# Patient Record
Sex: Female | Born: 1995 | Race: Black or African American | Hispanic: No | Marital: Single | Smoking: Never smoker
Health system: Southern US, Community
[De-identification: ages and names within clinical notes are randomized; demographics above are authoritative.]

## PROBLEM LIST (undated history)

## (undated) DIAGNOSIS — Z789 Other specified health status: Secondary | ICD-10-CM

## (undated) HISTORY — DX: Other specified health status: Z78.9

## (undated) HISTORY — PX: NO PAST SURGERIES: SHX2092

---

## 2020-11-09 ENCOUNTER — Encounter: Payer: Self-pay | Admitting: Obstetrics & Gynecology

## 2021-06-18 ENCOUNTER — Encounter (HOSPITAL_COMMUNITY): Payer: Self-pay | Admitting: Emergency Medicine

## 2021-06-18 ENCOUNTER — Other Ambulatory Visit: Payer: Self-pay

## 2021-06-18 ENCOUNTER — Ambulatory Visit (HOSPITAL_COMMUNITY)
Admission: EM | Admit: 2021-06-18 | Discharge: 2021-06-18 | Disposition: A | Discharge status: Home / Self Care | Payer: Medicaid Other | Attending: Emergency Medicine | Admitting: Emergency Medicine

## 2021-06-18 DIAGNOSIS — Z3201 Encounter for pregnancy test, result positive: Secondary | ICD-10-CM

## 2021-06-18 LAB — POCT URINALYSIS DIPSTICK, ED / UC
Bilirubin Urine: NEGATIVE
Glucose, UA: NEGATIVE mg/dL
Hgb urine dipstick: NEGATIVE
Ketones, ur: NEGATIVE mg/dL
Nitrite: NEGATIVE
Protein, ur: NEGATIVE mg/dL
Specific Gravity, Urine: 1.01 (ref 1.005–1.030)
Urobilinogen, UA: >=8 mg/dL (ref 0.0–1.0)
pH: 6 (ref 5.0–8.0)

## 2021-06-18 LAB — POC URINE PREG, ED: Preg Test, Ur: POSITIVE — AB

## 2021-06-18 MED ORDER — PRENATAL COMPLETE 14-0.4 MG PO TABS: 1.0000 | ORAL_TABLET | Freq: Every day | ORAL | 0 refills | Status: DC

## 2021-06-18 NOTE — ED Provider Notes (Signed)
[facility]    CSN: [id number] Arrival date & time: 06/18/21  1835      History   Chief Complaint Chief Complaint  Patient presents with   Nausea   Metrorrhagia    HPI Caylan Dericka Ostenson is a 25 y.o. female.   Patient here for evaluation of nausea and spotting for the past week.  LMP 05/07/2021.  Denies any abdominal pain or cramping.  Denies any dysuria, urgency, or frequency.  Denies any trauma, injury, or other precipitating event.  Denies any specific alleviating or aggravating factors.  Denies any fevers, chest pain, shortness of breath, N/V/D, numbness, tingling, weakness, abdominal pain, or headaches.     The history is provided by the patient.   History reviewed. No pertinent past medical history.  There are no problems to display for this patient.   History reviewed. No pertinent surgical history.  OB History   No obstetric history on file.      Home Medications    Prior to Admission medications   Medication Sig Start Date End Date Taking? Authorizing Provider  Prenatal Vit-Fe Fumarate-FA (PRENATAL COMPLETE) 14-0.4 MG TABS Take 1 tablet by mouth daily. 06/18/21  Yes Ivette Loyal, NP    Family History No family history on file.  Social History Social History   Tobacco Use   Smoking status: Never   Smokeless tobacco: Never  Substance Use Topics   Alcohol use: Not Currently   Drug use: Never     Allergies   Patient has no known allergies.   Review of Systems Review of Systems  Gastrointestinal:  Positive for nausea.  Genitourinary:  Positive for menstrual problem.  All other systems reviewed and are negative.   Physical Exam Triage Vital Signs ED Triage Vitals  Enc Vitals Group     BP 06/18/21 1909 131/73     Pulse Rate 06/18/21 1909 91     Resp 06/18/21 1909 15     Temp 06/18/21 1909 98.8 F (37.1 C)     Temp Source 06/18/21 1909 Oral     SpO2 06/18/21 1909 99 %     Weight --      Height 06/18/21 1909 5\' 3"  (1.6 m)      Head Circumference --      Peak Flow --      Pain Score 06/18/21 1907 0     Pain Loc --      Pain Edu? --      Excl. in GC? --    No data found.  Updated Vital Signs BP 131/73 (BP Location: Left Arm)   Pulse 91   Temp 98.8 F (37.1 C) (Oral)   Resp 15   Ht 5\' 3"  (1.6 m)   LMP 05/07/2021 (Exact Date)   SpO2 99%   Visual Acuity Right Eye Distance:   Left Eye Distance:   Bilateral Distance:    Right Eye Near:   Left Eye Near:    Bilateral Near:     Physical Exam Vitals and nursing note reviewed.  Constitutional:      General: She is not in acute distress.    Appearance: Normal appearance. She is not ill-appearing, toxic-appearing or diaphoretic.  HENT:     Head: Normocephalic and atraumatic.  Eyes:     Conjunctiva/sclera: Conjunctivae normal.  Cardiovascular:     Rate and Rhythm: Normal rate.     Pulses: Normal pulses.  Pulmonary:     Effort: Pulmonary effort is normal.  Abdominal:  General: Abdomen is flat.  Musculoskeletal:        General: Normal range of motion.     Cervical back: Normal range of motion.  Skin:    General: Skin is warm and dry.  Neurological:     General: No focal deficit present.     Mental Status: She is alert and oriented to person, place, and time.  Psychiatric:        Mood and Affect: Mood normal.     UC Treatments / Results  Labs (all labs ordered are listed, but only abnormal results are displayed) Labs Reviewed  POC URINE PREG, ED - Abnormal; Notable for the following components:      Result Value   Preg Test, Ur POSITIVE (*)    All other components within normal limits  POCT URINALYSIS DIPSTICK, ED / UC - Abnormal; Notable for the following components:   Leukocytes,Ua TRACE (*)    All other components within normal limits    EKG   Radiology No results found.  Procedures Procedures (including critical care time)  Medications Ordered in UC Medications - No data to display  Initial Impression / Assessment  and Plan / UC Course  I have reviewed the triage vital signs and the nursing notes.  Pertinent labs & imaging results that were available during my care of the patient were reviewed by me and considered in my medical decision making (see chart for details).    Assessment negative for red flags or concerns.  Urinalysis with no signs of infection.  Urine pregnancy test positive.  EDD 02/11/22, [redacted] weeks pregnant.  Prenatal vitamins prescribed daily.  Recommend eating small frequent meals or crackers to help with nausea.  May try ginger or mint for nausea.  Patient fact sheet on medications that are safe to take during pregnancy.  Follow up with [facility] as soon as possible.   Final Clinical Impressions(s) / UC Diagnoses   Final diagnoses:  Positive pregnancy test     Discharge Instructions      Take the prenatal vitamin daily.    Keep crackers or other small snacks around to help with nausea.  You can also try ginger (ginger candy or gingerale) or mint to help with nausea.    Follow up with [facility] as soon as possible.       ED Prescriptions     Medication Sig Dispense Auth. Provider   Prenatal Vit-Fe Fumarate-FA (PRENATAL COMPLETE) 14-0.4 MG TABS Take 1 tablet by mouth daily. 60 tablet Ivette Loyal, NP      PDMP not reviewed this encounter.   Ivette Loyal, NP 06/18/21 1942

## 2021-06-18 NOTE — Discharge Instructions (Addendum)
Take the prenatal vitamin daily.    Keep crackers or other small snacks around to help with nausea.  You can also try ginger (ginger candy or gingerale) or mint to help with nausea.    Follow up with [facility] as soon as possible.

## 2021-06-18 NOTE — ED Triage Notes (Signed)
Patient c/o nausea and irregular started 1 week ago.   Patient endorses increased nausea and emesis in the morning.   Patient endorses " having one day of bleeding last week and it immediatly went off".   Patient endorses intermittent ABD pain at times " but not today".   Patient denies dysuria.   Patient has tried Tums with no relief of symptoms.

## 2021-06-28 ENCOUNTER — Ambulatory Visit: Payer: Medicaid Other

## 2021-06-28 ENCOUNTER — Other Ambulatory Visit: Payer: Self-pay

## 2021-06-28 VITALS — BP 118/71 | HR 74 | Wt 173.2 lb

## 2021-06-28 DIAGNOSIS — Z3480 Encounter for supervision of other normal pregnancy, unspecified trimester: Secondary | ICD-10-CM

## 2021-06-28 DIAGNOSIS — O219 Vomiting of pregnancy, unspecified: Secondary | ICD-10-CM

## 2021-06-28 MED ORDER — GOJJI WEIGHT SCALE MISC: 1.0000 | Freq: Every day | 0 refills | Status: DC | PRN

## 2021-06-28 MED ORDER — BLOOD PRESSURE MONITOR AUTOMAT DEVI: 1.0000 | Freq: Every day | 0 refills | Status: DC

## 2021-06-28 MED ORDER — PROMETHAZINE HCL 25 MG PO TABS
25.0000 mg | ORAL_TABLET | Freq: Four times a day (QID) | ORAL | 1 refills | Status: DC | PRN
Start: 2021-06-28 — End: 2021-07-17

## 2021-06-28 NOTE — Progress Notes (Signed)
   Location: [facility] Patient: clinic Provider: clinic  PRENATAL INTAKE SUMMARY  [name] presents today New OB Nurse Interview.  OB History     Gravida  2   Para      Term      Preterm      AB  1   Living         SAB  1   IAB      Ectopic      Multiple      Live Births             I have reviewed the patient's medical, obstetrical, social, and family histories, medications, and available lab results.  SUBJECTIVE She complains of nausea with vomiting for 1-2 weeks  OBJECTIVE Initial Nurse interview for history (New OB)  EDD: 02/11/2022 GA: [redacted]w[redacted]d G2P0010 FHT: not assessed due to gestational age  GENERAL APPEARANCE: alert, well appearing, in no apparent distress, oriented to person, place and time   ASSESSMENT Normal pregnancy  PLAN Prenatal care:  [facility] Labs to be completed at next visit with Edd Arbour, CNM [facility] for BP monitor and weight scale Rx for Phenergan 25 mg 1 tab PO every 6 hours at needed for nausea/vomiting Patient to sign up for Babyscripts Ultrasound less than 14 weeks to complete viability and dating  Follow Up Instructions:   I discussed the assessment and treatment plan with the patient. The patient was provided an opportunity to ask questions and all were answered. The patient agreed with the plan and demonstrated an understanding of the instructions.   The patient was advised to call back or seek an in-person evaluation if the symptoms worsen or if the condition fails to improve as anticipated.  I provided 40 minutes of  face-to-face time during this encounter.  Clovis Pu, RN

## 2021-06-28 NOTE — Progress Notes (Signed)
Patient arrived for GYN appt and reports pregnancy.  Encounter changed from GYN to Initial OB with nurse intake visit completed.  Cherre Robins MSN, CNM Advanced Practice Provider, [facility] for [employer] [facility]

## 2021-06-28 NOTE — Patient Instructions (Addendum)
AREA PEDIATRIC/FAMILY PRACTICE PHYSICIANS  [facility] [address] [place], [place] [address] Phone - [phone]   Fax - [phone]  [facility] [address] [place], [place]  [address] Phone - [phone]   Fax - [phone]  [facility] [address] [place], [place]  [address] Phone - [phone]   Fax - [phone]  [facility] [address] [place], [place]  [address] Phone - [phone]   Fax - [phone]  [facility] [address] [place], [place]  [address] Phone - [phone]   Fax - [phone]  [facility] [address] [place], [place]  [address] Phone - [phone]   Fax - [phone]  [facility] [address] [place], [place]  [address] Phone - [phone]   Fax - [phone]  [facility] [address] [place], [place]  [address] Phone - [phone]   Fax - [phone]  [facility] [address] [place], [place]  [address] Phone - [phone]   Fax - [phone] [facility] [address] [place], [place]  [address] Phone - [phone]   Fax - [phone]  [facility] [address] [place], [place]  [address] Phone - [phone]   Fax - [phone]  [facility] [address] [place], [place]  [address] Phone - [phone]   Fax - [phone]  [facility] [address] [place], [place]  [address] Phone - [phone]   Fax - [phone]  [facility] [address] [place], [place]  [address] Phone - [phone]  [facility] [address] [place], [place]  [address] Phone - [phone]   Fax - [phone]  [facility] [address] [place], [place]  [address] Phone - [phone]   Fax - [phone]  [facility] [address] [place], [place]  [address] Phone - [phone]   Fax - [phone]  [facility] [address] [place], [place]  [address] Phone - [phone]   Fax - [phone]   [facility] [address] [place], [place]  [address] Phone - [phone]   Fax - [phone]  [facility] [address] [place], [place]  [address] Phone - [phone]   Fax - [phone]  [facility] [address] [place], [place]  [address] Phone - [phone]   Fax - [phone]  [facility] [address] [place], [place]  [address] Phone - [phone]   Fax - [phone]  [facility] [address] [place], [place]  [address] Phone - [phone]   Fax - [phone] [facility] [address] [place], [place]  [address] Phone - [phone]   Fax - [phone]  [facility] [address] [place], [place]  [address] Phone - [phone]   Fax - [phone]   [facility] [address] [place],   [address] Phone - [phone]   Fax - [phone]   

## 2021-07-03 ENCOUNTER — Telehealth: Payer: Self-pay | Admitting: Medical

## 2021-07-03 ENCOUNTER — Ambulatory Visit
Admission: RE | Admit: 2021-07-03 | Discharge: 2021-07-03 | Disposition: A | Discharge status: Home / Self Care | Payer: Medicaid Other | Source: Ambulatory Visit | Attending: Certified Nurse Midwife | Admitting: Certified Nurse Midwife

## 2021-07-03 ENCOUNTER — Other Ambulatory Visit: Payer: Self-pay | Admitting: Certified Nurse Midwife

## 2021-07-03 ENCOUNTER — Other Ambulatory Visit: Payer: Self-pay

## 2021-07-03 DIAGNOSIS — Z3480 Encounter for supervision of other normal pregnancy, unspecified trimester: Secondary | ICD-10-CM

## 2021-07-03 NOTE — Telephone Encounter (Signed)
I called Cheyenne Cantu today at 10:36 AM and confirmed patient's identity using two patient identifiers. [name] results from earlier today were reviewed. Patient is scheduled for new OB visit at [facility] on 07/17/21. First trimester warning signs reviewed. Patient advised that due date will remain 02/11/22 based on LMP due to reasonably certain LMP. Patient voiced understanding and had no further questions.   US OB Comp Less 14 Wks  Result Date: 07/03/2021 CLINICAL DATA:  Confirming dates and or viability in a 25 year old female whose last menstrual. Would suggest 8 weeks 1 day gestation, no current quantitative beta HCG is available. EXAM: OBSTETRIC <14 WK ULTRASOUND TECHNIQUE: Transabdominal ultrasound was performed for evaluation of the gestation as well as the maternal uterus and adnexal regions. COMPARISON:  None FINDINGS: Intrauterine gestational sac: Single Yolk sac:  Visualized Embryo:  Visualized Cardiac Activity: Visualized Heart Rate: 157 bpm CRL:   15.8 mm   8 w 0 d                  [name] EDC: 02/12/2022 Subchorionic hemorrhage:  None visualized. Maternal uterus/adnexae: No free fluid in the pelvis. Mild distortion of uterine and endometrial contour favored to represent myometrial contraction. This is rightward and anterior. Normal appearance of RIGHT and LEFT ovary. IMPRESSION: Viable intrauterine pregnancy at 8 weeks 0 days by crown-rump length with heart rate of 157 beats per minute. Electronically Signed   By: Donzetta Kohut M.D.   On: 07/03/2021 10:15    Marny Lowenstein, PA-C 07/03/2021 10:36 AM

## 2021-07-17 ENCOUNTER — Other Ambulatory Visit (HOSPITAL_COMMUNITY)
Admission: RE | Admit: 2021-07-17 | Discharge: 2021-07-17 | Disposition: A | Discharge status: Home / Self Care | Payer: Medicaid Other | Source: Ambulatory Visit | Attending: Certified Nurse Midwife | Admitting: Certified Nurse Midwife

## 2021-07-17 ENCOUNTER — Other Ambulatory Visit: Payer: Self-pay

## 2021-07-17 ENCOUNTER — Ambulatory Visit (INDEPENDENT_AMBULATORY_CARE_PROVIDER_SITE_OTHER): Payer: Medicaid Other | Admitting: Certified Nurse Midwife

## 2021-07-17 VITALS — BP 112/72 | HR 88 | Wt 171.5 lb

## 2021-07-17 DIAGNOSIS — Z113 Encounter for screening for infections with a predominantly sexual mode of transmission: Secondary | ICD-10-CM

## 2021-07-17 DIAGNOSIS — Z481 Encounter for planned postprocedural wound closure: Secondary | ICD-10-CM | POA: Diagnosis present

## 2021-07-17 DIAGNOSIS — Z3491 Encounter for supervision of normal pregnancy, unspecified, first trimester: Secondary | ICD-10-CM | POA: Diagnosis not present

## 2021-07-17 DIAGNOSIS — Z3481 Encounter for supervision of other normal pregnancy, first trimester: Secondary | ICD-10-CM

## 2021-07-17 DIAGNOSIS — Z3A10 10 weeks gestation of pregnancy: Secondary | ICD-10-CM | POA: Diagnosis not present

## 2021-07-17 NOTE — Progress Notes (Signed)
History:   Cheyenne Cantu is a 25 y.o. G2P0010 at [redacted]w[redacted]d by LMP being seen today for her first obstetrical visit.  Her obstetrical history is significant for  one miscarriage at 11 weeks . Patient intends to breast and bottle feed. Pregnancy history fully reviewed.  Patient reports  improved nausea but still little appetite. Otherwise doing well but feeling some anxiety since it was at her first OB visit that they could not find a heartbeat in her last pregnancy . Leaving for college on Sunday, will be in [place], [place] (about 3hrs away). Unsure where to deliver but feels it should be closer to school.   HISTORY: OB History  Gravida Para Term Preterm AB Living  2 0 0 0 1 0  SAB IAB Ectopic Multiple Live Births  1 0 0 0 0    # Outcome Date GA Lbr Len/2nd Weight Sex Delivery Anes PTL Lv  2 Current           1 SAB 2020            Last pap smear was done "a couple of years ago" and was normal  Past Medical History:  Diagnosis Date   Medical history non-contributory    Past Surgical History:  Procedure Laterality Date   NO PAST SURGERIES     Family History  Problem Relation Age of Onset   Diabetes Father    Social History   Tobacco Use   Smoking status: Never    Passive exposure: Never   Smokeless tobacco: Never  Vaping Use   Vaping Use: Never used  Substance Use Topics   Alcohol use: Not Currently   Drug use: Never   No Known Allergies Current Outpatient Medications on File Prior to Visit  Medication Sig Dispense Refill   Blood Pressure Monitoring (BLOOD PRESSURE MONITOR AUTOMAT) DEVI 1 Device by Does not apply route daily. Automatic blood pressure cuff regular size. To monitor blood pressure regularly at home. ICD-10 code:Z34.90 1 each 0   Misc. Devices (GOJJI WEIGHT SCALE) MISC 1 Device by Does not apply route daily as needed. To weight self daily as needed at home. ICD-10 code: Z34.90 1 each 0   Prenatal Vit-Fe Fumarate-FA (PRENATAL COMPLETE) 14-0.4 MG TABS  Take 1 tablet by mouth daily. 60 tablet 0   promethazine (PHENERGAN) 25 MG tablet Take 1 tablet (25 mg total) by mouth every 6 (six) hours as needed for nausea or vomiting. (Patient not taking: Reported on 07/17/2021) 30 tablet 1   No current facility-administered medications on file prior to visit.    Review of Systems Pertinent items noted in HPI and remainder of comprehensive ROS otherwise negative. Physical Exam:   Vitals:   07/17/21 1101  BP: 112/72  Pulse: 88  Weight: 171 lb 8 oz (77.8 kg)   Fetal Heart Rate (bpm): 152 Bedside Ultrasound for FHR check: Viable intrauterine pregnancy with positive cardiac activity noted to great relief of patient. Patient informed that the ultrasound is considered a limited obstetric ultrasound and is not intended to be a complete ultrasound exam.  Patient also informed that the ultrasound is not being completed with the intent of assessing for fetal or placental anomalies or any pelvic abnormalities.  Explained that the purpose of today's ultrasound is to assess for fetal heart rate.  Patient acknowledges the purpose of the exam and the limitations of the study.  Constitutional: Well-developed, well-nourished pregnant female in no acute distress.  HEENT: PERRLA Skin: normal color and turgor,  no rash Cardiovascular: normal rate & rhythm, no murmur Respiratory: normal effort, lung sounds clear throughout GI: Abd soft, non-tender, pos BS x 4, gravid appropriate for gestational age MS: Extremities nontender, no edema, normal ROM Neurologic: Alert and oriented x 4.  GU: no CVA tenderness Pelvic: NEFG, physiologic discharge, no blood, cervix clean. Pap/swabs collected  Assessment & Plan:  1. Encounter for supervision of other normal pregnancy in first trimester - Doing well, not yet feeling fetal movement - Genetic Screening - Hemoglobin A1c - Culture, OB Urine - CBC/D/Plt+RPR+Rh+ABO+RubIgG... - Cytology - PAP( [facility]) - [name] MFM OB COMP + 14  WK; Future  2. [redacted] weeks gestation of pregnancy - Routine OB care - Discussed possible delivery sites, patient settled on the [place] area since it is closer to school and she has family there. Gave contact info for [facility], a low risk practice that accepts Medicaid in the [place]  3. Routine screening for STI (sexually transmitted infection) - Cervicovaginal ancillary only( [place])  4. Initial obstetric visit in first trimester - Initial labs drawn. - Continue prenatal vitamins. - Problem list reviewed and updated. - Genetic Screening discussed, First trimester screen, Quad screen, and NIPS: ordered. - Ultrasound discussed; fetal anatomic survey: ordered. - Anticipatory guidance about prenatal visits given including labs, ultrasounds, and testing. - Discussed usage of Babyscripts and virtual visits as additional source of managing and completing prenatal visits in midst of coronavirus and pandemic.   - Encouraged to complete MyChart Registration for her ability to review results, send requests, and have questions addressed.  - The nature of [place] [facility]/Faculty Practice with multiple MDs and Advanced Practice Providers was explained to patient; also emphasized that residents, students are part of our team. - Routine obstetric precautions reviewed. Encouraged to seek out care at office or emergency room [facility] preferred) for urgent and/or emergent concerns. Return in about 4 weeks (around 08/14/2021) for IN-PERSON, LOB.     Edd Arbour, MSN, CNM, IBCLC Certified Nurse Midwife, [facility]

## 2021-07-18 ENCOUNTER — Encounter: Payer: Self-pay | Admitting: *Deleted

## 2021-07-18 LAB — CERVICOVAGINAL ANCILLARY ONLY
Bacterial Vaginitis (gardnerella): NEGATIVE
Candida Glabrata: NEGATIVE
Candida Vaginitis: NEGATIVE
Chlamydia: NEGATIVE
Comment: NEGATIVE
Comment: NEGATIVE
Comment: NEGATIVE
Comment: NEGATIVE
Comment: NEGATIVE
Comment: NORMAL
Neisseria Gonorrhea: NEGATIVE
Trichomonas: NEGATIVE

## 2021-07-18 LAB — CBC/D/PLT+RPR+RH+ABO+RUBIGG...
Antibody Screen: NEGATIVE
Basophils Absolute: 0 10*3/uL (ref 0.0–0.2)
Basos: 0 %
EOS (ABSOLUTE): 0.1 10*3/uL (ref 0.0–0.4)
Eos: 1 %
HCV Ab: <0.1 s/co ratio (ref 0.0–0.9)
HIV Screen 4th Generation wRfx: NONREACTIVE
Hematocrit: 37.8 % (ref 34.0–46.6)
Hemoglobin: 12.5 g/dL (ref 11.1–15.9)
Hepatitis B Surface Ag: NEGATIVE
Immature Grans (Abs): 0 10*3/uL (ref 0.0–0.1)
Immature Granulocytes: 0 %
Lymphocytes Absolute: 1.8 10*3/uL (ref 0.7–3.1)
Lymphs: 23 %
MCH: 27.1 pg (ref 26.6–33.0)
MCHC: 33.1 g/dL (ref 31.5–35.7)
MCV: 82 fL (ref 79–97)
Monocytes Absolute: 0.5 10*3/uL (ref 0.1–0.9)
Monocytes: 6 %
Neutrophils Absolute: 5.4 10*3/uL (ref 1.4–7.0)
Neutrophils: 70 %
Platelets: 251 10*3/uL (ref 150–450)
RBC: 4.62 x10E6/uL (ref 3.77–5.28)
RDW: 15.6 % — ABNORMAL HIGH (ref 11.7–15.4)
RPR Ser Ql: NONREACTIVE
Rh Factor: NEGATIVE
Rubella Antibodies, IGG: 4.9 index (ref >0.99)
WBC: 7.8 10*3/uL (ref 3.4–10.8)

## 2021-07-18 LAB — HEMOGLOBIN A1C
Est. average glucose Bld gHb Est-mCnc: 111 mg/dL
Hgb A1c MFr Bld: 5.5 % (ref 4.8–5.6)

## 2021-07-18 LAB — HCV INTERPRETATION

## 2021-07-19 LAB — CYTOLOGY - PAP
Comment: NEGATIVE
Diagnosis: HIGH — AB
High risk HPV: POSITIVE — AB

## 2021-07-19 LAB — URINE CULTURE, OB REFLEX: Organism ID, Bacteria: NO GROWTH

## 2021-07-19 LAB — CULTURE, OB URINE

## 2021-08-14 ENCOUNTER — Encounter: Payer: Self-pay | Admitting: Nurse Practitioner

## 2021-08-14 DIAGNOSIS — R87610 Atypical squamous cells of undetermined significance on cytologic smear of cervix (ASC-US): Secondary | ICD-10-CM | POA: Insufficient documentation

## 2021-08-15 ENCOUNTER — Encounter: Payer: Medicaid Other | Admitting: Nurse Practitioner

## 2021-08-20 ENCOUNTER — Encounter: Payer: Medicaid Other | Admitting: Student

## 2021-08-21 ENCOUNTER — Encounter: Payer: Medicaid Other | Admitting: Obstetrics and Gynecology

## 2021-08-22 ENCOUNTER — Encounter: Payer: Self-pay | Admitting: General Practice

## 2021-08-26 ENCOUNTER — Telehealth: Payer: Self-pay | Admitting: General Practice

## 2021-08-26 NOTE — Telephone Encounter (Signed)
Attempted to call patient regarding horizon test results, message stated call could not be completed. Will send mychart message.

## 2021-09-05 ENCOUNTER — Other Ambulatory Visit: Payer: Self-pay

## 2021-09-05 ENCOUNTER — Other Ambulatory Visit (HOSPITAL_COMMUNITY)
Admission: RE | Admit: 2021-09-05 | Discharge: 2021-09-05 | Disposition: A | Discharge status: Home / Self Care | Payer: Medicaid Other | Source: Ambulatory Visit | Attending: Student | Admitting: Student

## 2021-09-05 ENCOUNTER — Ambulatory Visit (INDEPENDENT_AMBULATORY_CARE_PROVIDER_SITE_OTHER): Payer: Medicaid Other | Admitting: Obstetrics & Gynecology

## 2021-09-05 VITALS — BP 126/71 | HR 97 | Wt 177.1 lb

## 2021-09-05 DIAGNOSIS — R87610 Atypical squamous cells of undetermined significance on cytologic smear of cervix (ASC-US): Secondary | ICD-10-CM | POA: Insufficient documentation

## 2021-09-05 DIAGNOSIS — Z3480 Encounter for supervision of other normal pregnancy, unspecified trimester: Secondary | ICD-10-CM

## 2021-09-05 DIAGNOSIS — R87810 Cervical high risk human papillomavirus (HPV) DNA test positive: Secondary | ICD-10-CM

## 2021-09-05 NOTE — Progress Notes (Signed)
Patient ID: Tylea Hise, female   DOB: 1996/12/01, 25 y.o.   MRN: [id number]  Chief Complaint  Patient presents with   Routine Prenatal Visit  Colposcopy  HPI Sibyl Abbee Cremeens is a 25 y.o. female.  G2P0010  HPI  Indications: Pap smear on August 2022 showed: ASC cannot exclude high grade lesion [facility]). Previous colposcopy: no . Prior cervical treatment: no treatment.  Past Medical History:  Diagnosis Date   Medical history non-contributory     Past Surgical History:  Procedure Laterality Date   NO PAST SURGERIES      Family History  Problem Relation Age of Onset   Diabetes Father     Social History Social History   Tobacco Use   Smoking status: Never    Passive exposure: Never   Smokeless tobacco: Never  Vaping Use   Vaping Use: Never used  Substance Use Topics   Alcohol use: Not Currently   Drug use: Never    No Known Allergies  Current Outpatient Medications  Medication Sig Dispense Refill   Prenatal Vit-Fe Fumarate-FA (PRENATAL COMPLETE) 14-0.4 MG TABS Take 1 tablet by mouth daily. 60 tablet 0   Blood Pressure Monitoring (BLOOD PRESSURE MONITOR AUTOMAT) DEVI 1 Device by Does not apply route daily. Automatic blood pressure cuff regular size. To monitor blood pressure regularly at home. ICD-10 code:Z34.90 (Patient not taking: Reported on 09/05/2021) 1 each 0   Misc. Devices (GOJJI WEIGHT SCALE) MISC 1 Device by Does not apply route daily as needed. To weight self daily as needed at home. ICD-10 code: Z34.90 (Patient not taking: Reported on 09/05/2021) 1 each 0   No current facility-administered medications for this visit.    Review of Systems Review of Systems  Blood pressure 126/71, pulse 97, weight 177 lb 1.6 oz (80.3 kg), last menstrual period 05/07/2021.  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  Speculum placed in vagina and excellent visualization of cervix achieved, cervix swabbed x 3 with acetic acid solution. Patient given informed consent, signed copy in the chart, time out was performed.  Placed in lithotomy position. Cervix viewed with speculum and colposcope after application of acetic acid.   Colposcopy adequate?  yes Acetowhite lesions?yes 3-11 Punctation?no Mosaicism?  no Abnormal vasculature?  no Biopsies?yes 900 ECC?no  COMMENTS: Patient was given post procedure instructions.  Marland Kitchen    Specimens: Bx at 9 00  Complications: none.     Plan    Specimens labelled and sent to Pathology. Return to discuss Pathology results in 2 weeks.      Scheryl Darter 09/05/2021, 3:45 PM

## 2021-09-05 NOTE — Addendum Note (Signed)
Addended by: Isabell Jarvis on: 09/05/2021 04:58 PM   Modules accepted: Orders

## 2021-09-05 NOTE — Progress Notes (Signed)
   PRENATAL VISIT NOTE  Subjective:  Cheyenne Cantu is a 25 y.o. G2P0010 at [redacted]w[redacted]d being seen today for ongoing prenatal care.  She is currently monitored for the following issues for this high-risk pregnancy and has Supervision of other normal pregnancy, antepartum and Atypical squamous cell changes of undetermined significance (ASCUS) on cervical cytology with positive high risk human papilloma virus (HPV) on their problem list.  Patient reports no complaints.  Contractions: Not present. Vag. Bleeding: None.  Movement: Absent. Denies leaking of fluid.   The following portions of the patient's history were reviewed and updated as appropriate: allergies, current medications, past family history, past medical history, past social history, past surgical history and problem list.   Objective:   Vitals:   09/05/21 1509  BP: 126/71  Pulse: 97  Weight: 177 lb 1.6 oz (80.3 kg)    Fetal Status: Fetal Heart Rate (bpm): 160   Movement: Absent     General:  Alert, oriented and cooperative. Patient is in no acute distress.  Skin: Skin is warm and dry. No rash noted.   Cardiovascular: Normal heart rate noted  Respiratory: Normal respiratory effort, no problems with respiration noted  Abdomen: Soft, gravid, appropriate for gestational age.  Pain/Pressure: Present     Pelvic: Cervical exam performed in the presence of a chaperone        Extremities: Normal range of motion.  Edema: None  Mental Status: Normal mood and affect. Normal behavior. Normal judgment and thought content.   Assessment and Plan:  Pregnancy: G2P0010 at [redacted]w[redacted]d 1. Atypical squamous cell changes of undetermined significance (ASCUS) on cervical cytology with positive high risk human papilloma virus (HPV) colpo done  2. Supervision of other normal pregnancy, antepartum Will transfer care to [facility]  Preterm labor symptoms and general obstetric precautions including but not limited to vaginal bleeding, contractions,  leaking of fluid and fetal movement were reviewed in detail with the patient. Please refer to After Visit Summary for other counseling recommendations.   Return in about 4 weeks (around 10/03/2021).  Future Appointments  Date Time Provider Department Center  09/17/2021 10:45 AM [facility] US[facility]    Scheryl Darter, MD

## 2021-09-09 LAB — SURGICAL PATHOLOGY

## 2021-09-17 ENCOUNTER — Ambulatory Visit: Payer: Medicaid Other | Attending: Certified Nurse Midwife

## 2022-07-02 ENCOUNTER — Ambulatory Visit: Payer: Medicaid Other | Admitting: Podiatry

## 2022-08-25 IMAGING — US US OB COMP LESS 14 WK
1 series · 15 of 28 positions shown · non-contrast
Comparison: None

CLINICAL DATA: Confirming dates and or viability in a 24-year-old
female whose last menstrual. Would suggest 8 weeks 1 day gestation,
no current quantitative beta HCG is available.

EXAM:
OBSTETRIC <14 WK ULTRASOUND
TECHNIQUE: Transabdominal ultrasound was performed for evaluation of the
gestation as well as the maternal uterus and adnexal regions.

[Series 1: us ob comp less 14 wk · 15 of 67 slices shown]
[im 1/67]
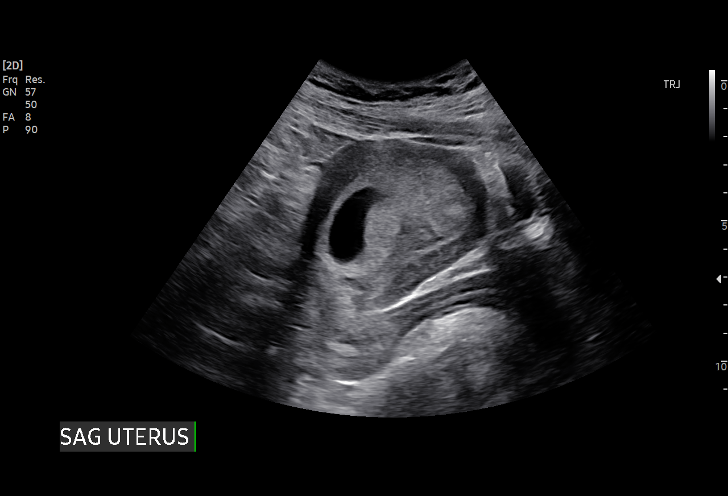
[im 5/67]
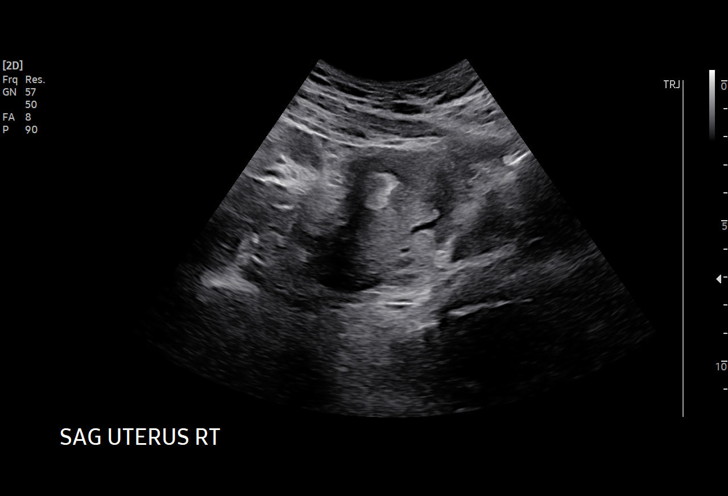
[im 10/67]
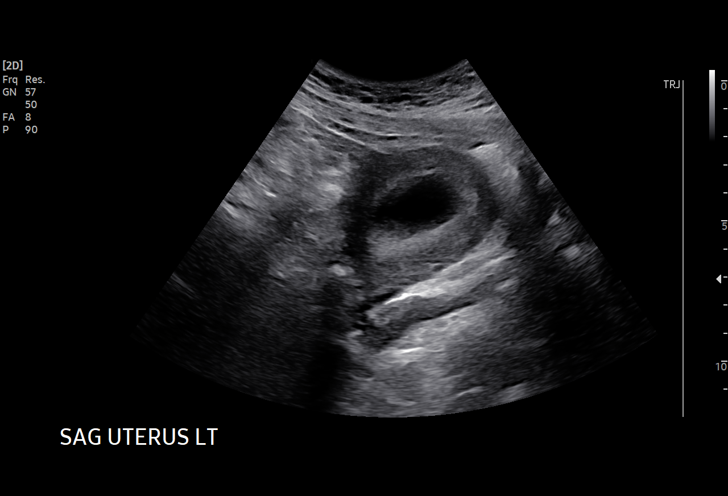
[im 15/67]
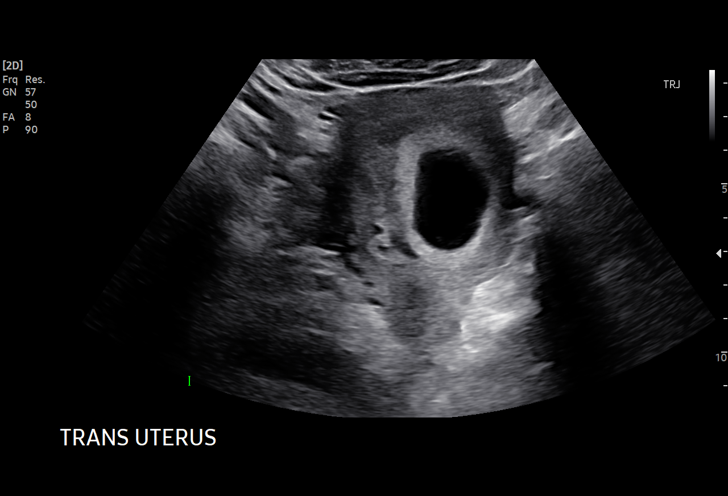
[im 20/67]
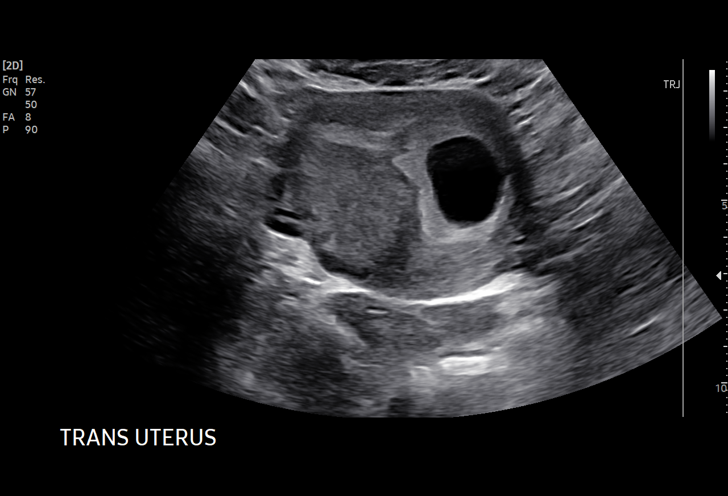
[im 25/67]
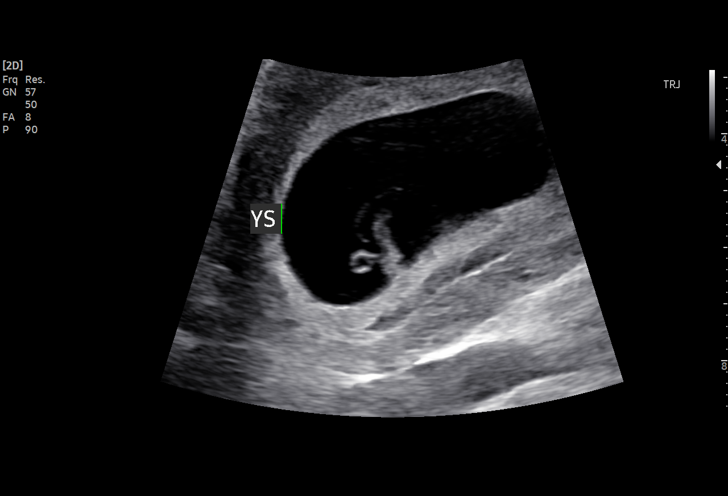
[im 30/67]
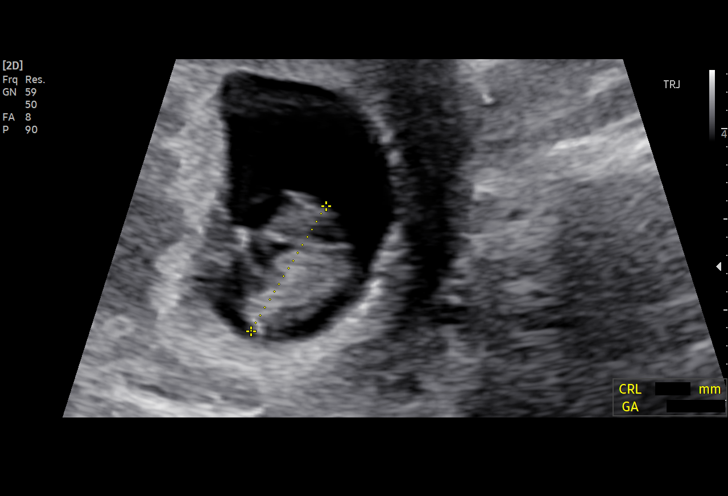
[im 35/67]
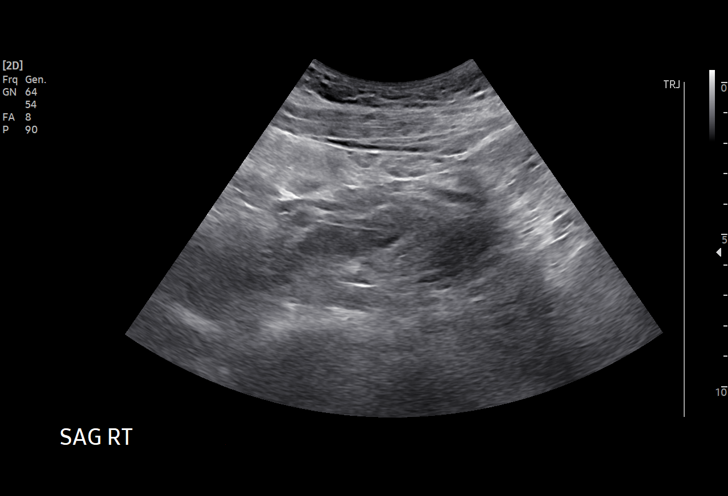
[im 37/67]
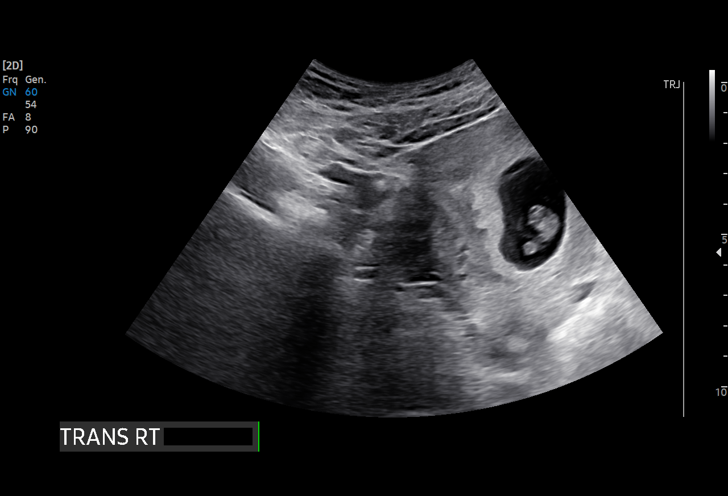
[im 42/67]
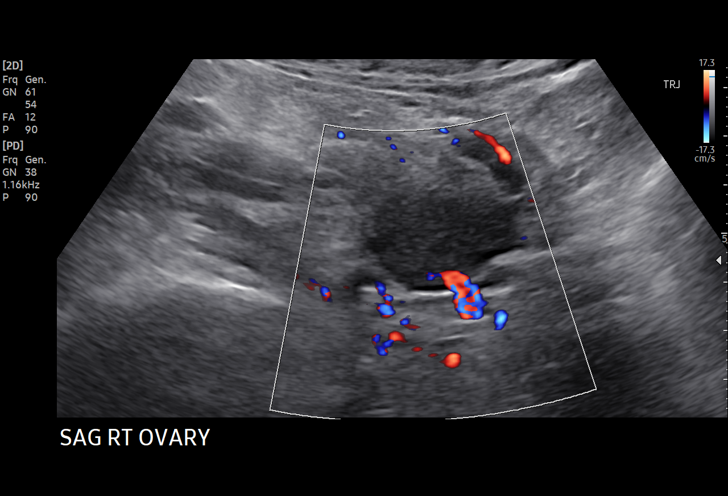
[im 47/67]
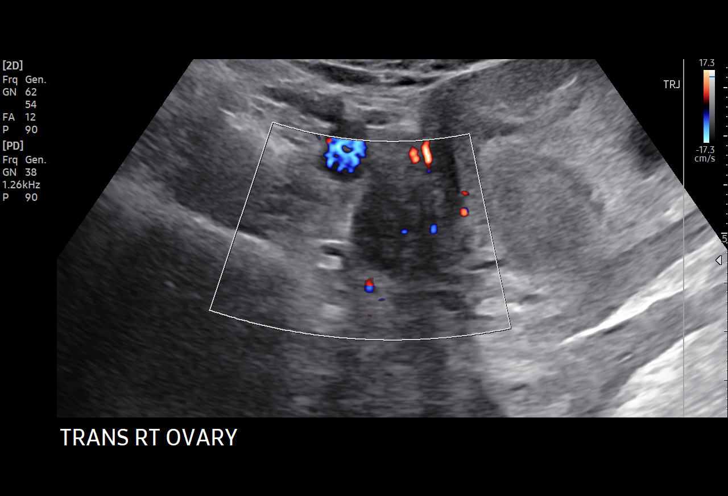
[im 52/67]
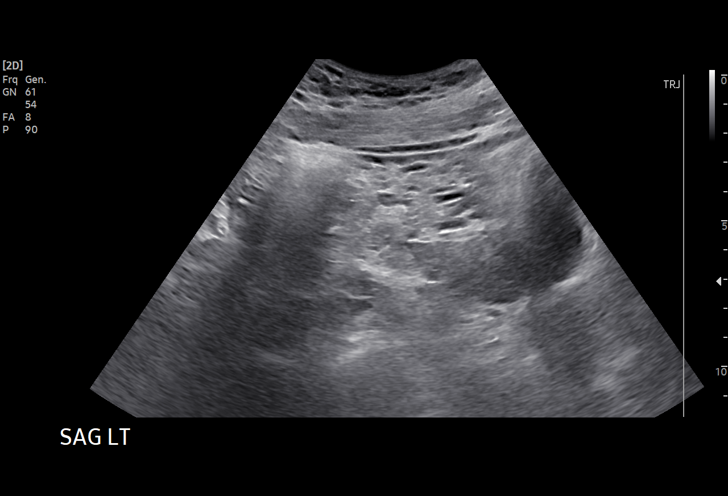
[im 57/67]
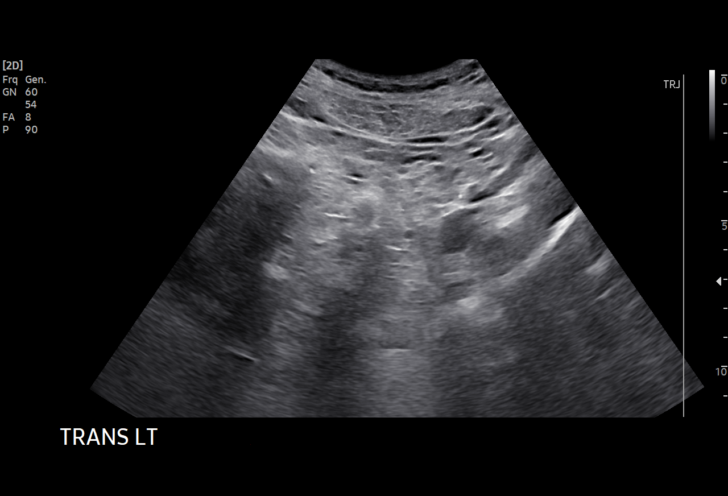
[im 62/67]
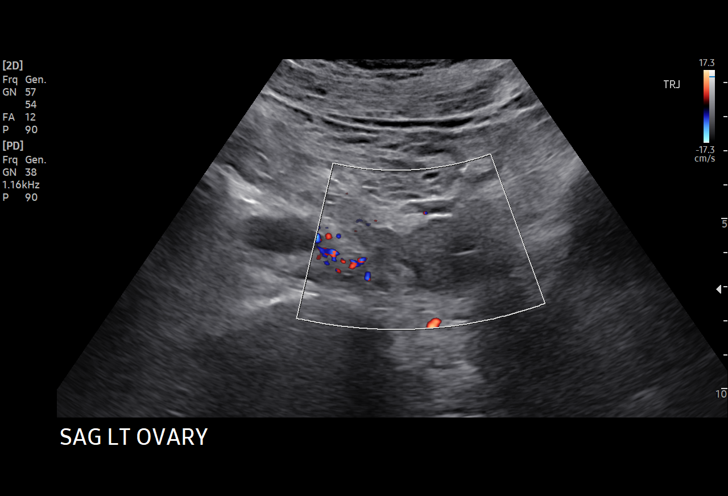
[im 67/67]
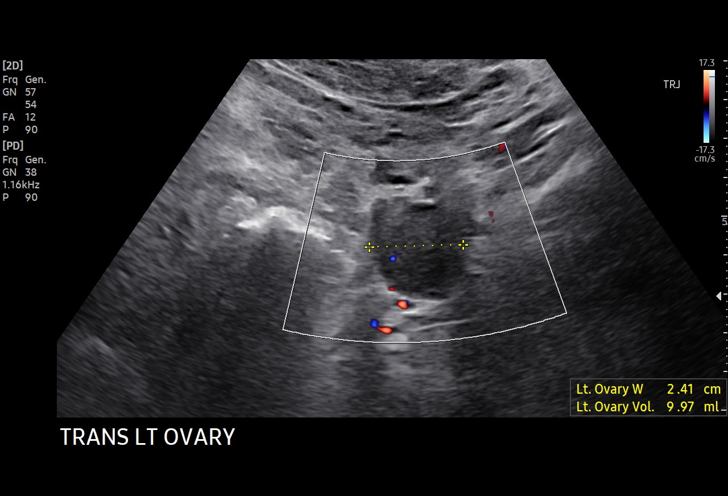

[15 of 28 positions shown; findings below may reference images not displayed]

FINDINGS: Intrauterine gestational sac: Single

Yolk sac:  Visualized

Embryo:  Visualized

Cardiac Activity: Visualized

Heart Rate: 157 bpm

CRL:   15.8 mm   8 w 0 d                  US EDC: 02/12/2022

Subchorionic hemorrhage:  None visualized.

Maternal uterus/adnexae: No free fluid in the pelvis. Mild
distortion of uterine and endometrial contour favored to represent
myometrial contraction. This is rightward and anterior. Normal
appearance of RIGHT and LEFT ovary.
IMPRESSION: Viable intrauterine pregnancy at 8 weeks 0 days by crown-rump length
with heart rate of 157 beats per minute.

## 2023-02-08 ENCOUNTER — Ambulatory Visit (HOSPITAL_COMMUNITY)
Admission: EM | Admit: 2023-02-08 | Discharge: 2023-02-08 | Disposition: A | Discharge status: Home / Self Care | Payer: Medicaid Other | Attending: Nurse Practitioner | Admitting: Nurse Practitioner

## 2023-02-08 ENCOUNTER — Encounter (HOSPITAL_COMMUNITY): Payer: Self-pay

## 2023-02-08 DIAGNOSIS — J069 Acute upper respiratory infection, unspecified: Secondary | ICD-10-CM | POA: Diagnosis present

## 2023-02-08 DIAGNOSIS — N760 Acute vaginitis: Secondary | ICD-10-CM | POA: Diagnosis present

## 2023-02-08 LAB — POC URINE PREG, ED: Preg Test, Ur: NEGATIVE

## 2023-02-08 MED ORDER — PREDNISONE 20 MG PO TABS: 40.0000 mg | ORAL_TABLET | Freq: Every day | ORAL | 0 refills | Status: AC

## 2023-02-08 MED ORDER — ALBUTEROL SULFATE HFA 108 (90 BASE) MCG/ACT IN AERS: 2.0000 | INHALATION_SPRAY | RESPIRATORY_TRACT | 0 refills | Status: AC | PRN

## 2023-02-08 MED ORDER — PREDNISONE 20 MG PO TABS: 40.0000 mg | ORAL_TABLET | Freq: Every day | ORAL | 0 refills | Status: DC

## 2023-02-08 MED ORDER — PSEUDOEPH-BROMPHEN-DM 30-2-10 MG/5ML PO SYRP: 5.0000 mL | ORAL_SOLUTION | ORAL | 0 refills | Status: AC | PRN

## 2023-02-08 MED ORDER — PSEUDOEPH-BROMPHEN-DM 30-2-10 MG/5ML PO SYRP: 5.0000 mL | ORAL_SOLUTION | ORAL | 0 refills | Status: DC | PRN

## 2023-02-08 NOTE — ED Provider Notes (Signed)
[facility]    CSN: [id number] Arrival date & time: 02/08/23  1240      History   Chief Complaint Chief Complaint  Patient presents with   Cough   UTI Symptoms    HPI Cheyenne Cantu is a 27 y.o. female.   Subjective:   Cheyenne Cantu is a 27 y.o. female who presents for evaluation of symptoms of a URI. Symptoms include nonproductive cough, wheezing, shortness of breath and sore throat. Onset of symptoms was 1 week ago and has been unchanged since that time.  She denies any fevers, chills, body aches, runny nose, congestion, nausea, vomiting, diarrhea, headache or dizziness.    The patient is also concerned for possible UTI and/or yeast infection. Patient reports foul smelling urine, urinary frequency, incontinence [when coughing], and vaginal discharge. The discharge is thick and white. She denies any dysuria.   LMP 01/10/2023.  She is sexually active.  Not on birth control.  Does not use condoms.    She is drinking plenty of fluids.  She has not tried anything for her symptoms.  She denies any known sick contacts.    The following portions of the patient's history were reviewed and updated as appropriate: allergies, current medications, past family history, past medical history, past social history, past surgical history, and problem list.       Past Medical History:  Diagnosis Date   Medical history non-contributory     Patient Active Problem List   Diagnosis Date Noted   Atypical squamous cell changes of undetermined significance (ASCUS) on cervical cytology with positive high risk human papilloma virus (HPV) 08/14/2021   Supervision of other normal pregnancy, antepartum 06/28/2021    Past Surgical History:  Procedure Laterality Date   NO PAST SURGERIES      OB History     Gravida  2   Para      Term      Preterm      AB  1   Living         SAB  1   IAB      Ectopic      Multiple      Live Births                Home Medications    Prior to Admission medications   Medication Sig Start Date End Date Taking? Authorizing Provider  albuterol (VENTOLIN HFA) 108 (90 Base) MCG/ACT inhaler Inhale 2 puffs into the lungs every 4 (four) hours as needed for wheezing or shortness of breath. 02/08/23  Yes Enrique Sack, FNP  brompheniramine-pseudoephedrine-DM 30-2-10 MG/5ML syrup Take 5 mLs by mouth every 4 (four) hours as needed (cough). 02/08/23   Enrique Sack, FNP  predniSONE (DELTASONE) 20 MG tablet Take 2 tablets (40 mg total) by mouth daily for 5 days. 02/08/23 02/13/23  Enrique Sack, FNP    Family History Family History  Problem Relation Age of Onset   Diabetes Father     Social History Social History   Tobacco Use   Smoking status: Never    Passive exposure: Never   Smokeless tobacco: Never  Vaping Use   Vaping Use: Never used  Substance Use Topics   Alcohol use: Not Currently   Drug use: Never     Allergies   Patient has no known allergies.   Review of Systems Review of Systems  Constitutional:  Negative for fever.  HENT:  Positive for sore throat. Negative for congestion, sneezing and  trouble swallowing.   Respiratory:  Positive for cough, shortness of breath and wheezing.   Gastrointestinal:  Negative for nausea and vomiting.  Genitourinary:  Positive for frequency and vaginal discharge. Negative for dysuria.  Musculoskeletal:  Negative for back pain and myalgias.  Neurological:  Negative for headaches.  All other systems reviewed and are negative.    Physical Exam Triage Vital Signs ED Triage Vitals  Enc Vitals Group     BP 02/08/23 1423 111/71     Pulse Rate 02/08/23 1423 77     Resp 02/08/23 1423 16     Temp 02/08/23 1423 98.6 F (37 C)     Temp Source 02/08/23 1423 Oral     SpO2 02/08/23 1423 98 %     Weight 02/08/23 1416 180 lb (81.6 kg)     Height 02/08/23 1416 '5\' 2"'$  (1.575 m)     Head Circumference --      Peak Flow --      Pain Score  02/08/23 1416 0     Pain Loc --      Pain Edu? --      Excl. in [place]? --    No data found.  Updated Vital Signs BP 111/71 (BP Location: Left Arm)   Pulse 77   Temp 98.6 F (37 C) (Oral)   Resp 16   Ht '5\' 2"'$  (1.575 m)   Wt 180 lb (81.6 kg)   LMP 01/10/2023 (Exact Date)   SpO2 98%   BMI 32.92 kg/m   Visual Acuity Right Eye Distance:   Left Eye Distance:   Bilateral Distance:    Right Eye Near:   Left Eye Near:    Bilateral Near:     Physical Exam Vitals reviewed.  Constitutional:      General: She is not in acute distress.    Appearance: Normal appearance. She is normal weight. She is not ill-appearing, toxic-appearing or diaphoretic.  HENT:     Head: Normocephalic.     Nose: Nose normal.     Mouth/Throat:     Mouth: Mucous membranes are moist.     Pharynx: No oropharyngeal exudate or posterior oropharyngeal erythema.  Eyes:     Conjunctiva/sclera: Conjunctivae normal.  Cardiovascular:     Rate and Rhythm: Normal rate.  Pulmonary:     Effort: Pulmonary effort is normal.  Genitourinary:    Comments: Deferred; patient perform self swab for testing Musculoskeletal:        General: Normal range of motion.     Cervical back: Normal range of motion and neck supple.  Lymphadenopathy:     Cervical: No cervical adenopathy.  Skin:    General: Skin is warm and dry.  Neurological:     General: No focal deficit present.     Mental Status: She is alert and oriented to person, place, and time.      UC Treatments / Results  Labs (all labs ordered are listed, but only abnormal results are displayed) Labs Reviewed  POCT URINALYSIS DIPSTICK, ED / UC  POC URINE PREG, ED  CERVICOVAGINAL ANCILLARY ONLY    EKG   Radiology No results found.  Procedures Procedures (including critical care time)  Medications Ordered in UC Medications - No data to display  Initial Impression / Assessment and Plan / UC Course  I have reviewed the triage vital signs and the nursing  notes.  Pertinent labs & imaging results that were available during my care of the patient were reviewed by me  and considered in my medical decision making (see chart for details).    27 yo female presenting with URI as well as possible UTI and/or yeast infection.  Patient is afebrile and nontoxic.  Physical exam as above.  Urine pregnancy negative.  Urinalysis negative for acute infection.Testing for gonorrhea, chlamydia, trichomonas, bacterial and yeast pending. Discussed diagnosis and treatment of URI. Discussed the importance of avoiding unnecessary antibiotic therapy. Suggested symptomatic OTC remedies.  Further recommendations pending results of outstanding test.  Today's evaluation has revealed no signs of a dangerous process. Discussed diagnosis with patient and/or guardian. Patient and/or guardian aware of their diagnosis, possible red flag symptoms to watch out for and need for close follow up. Patient and/or guardian understands verbal and written discharge instructions. Patient and/or guardian comfortable with plan and disposition.  Patient and/or guardian has a clear mental status at this time, good insight into illness (after discussion and teaching) and has clear judgment to make decisions regarding their care  Documentation was completed with the aid of voice recognition software. Transcription may contain typographical errors. Final Clinical Impressions(s) / UC Diagnoses   Final diagnoses:  Viral URI with cough  Vaginitis and vulvovaginitis     Discharge Instructions      Your symptoms are likely due to a viral respiratory infection. A respiratory infection is an illness that affects part of the respiratory system, such as the lungs, nose, or throat. Antibiotic medicines are not prescribed for viral infections. This is because antibiotics are designed to kill bacteria. They do not kill viruses. Take medications for cough as prescribed. You may take tylenol or ibuprofen as needed  for fevers/headache/body aches. Drink plenty of fluids. Your urine test did not show any signs of a urinary tract infection. Testing for bacteria, yeast, gonorrhea, chlamydia and trichomonas is pending. You should not have any sexual activity until you receive the results of the tests. You will only be notified for positive results. You may go online to [place] and review your results.      ED Prescriptions     Medication Sig Dispense Auth. Provider   predniSONE (DELTASONE) 20 MG tablet  (Status: Discontinued) Take 2 tablets (40 mg total) by mouth daily for 5 days. 10 tablet Enrique Sack, FNP   brompheniramine-pseudoephedrine-DM 30-2-10 MG/5ML syrup  (Status: Discontinued) Take 5 mLs by mouth every 4 (four) hours as needed (cough). 90 mL Enrique Sack, FNP   brompheniramine-pseudoephedrine-DM 30-2-10 MG/5ML syrup Take 5 mLs by mouth every 4 (four) hours as needed (cough). 90 mL Enrique Sack, FNP   predniSONE (DELTASONE) 20 MG tablet Take 2 tablets (40 mg total) by mouth daily for 5 days. 10 tablet Enrique Sack, FNP   albuterol (VENTOLIN HFA) 108 (90 Base) MCG/ACT inhaler Inhale 2 puffs into the lungs every 4 (four) hours as needed for wheezing or shortness of breath. 1 each Enrique Sack, FNP      PDMP not reviewed this encounter.   Enrique Sack, [place] 02/08/23 [phone]

## 2023-02-08 NOTE — ED Triage Notes (Signed)
Cough started about a week ago (Day 7 now). Now "wheezing" with occasional SOB. Some runny nose. No fever.   Also here for "possible UTI". Having Urinary frequency with "strong odor". No abnormal color. No dysuria. No change in stream. No concern for STI. No vaginal discharge.

## 2023-02-08 NOTE — Discharge Instructions (Signed)
Your symptoms are likely due to a viral respiratory infection. A respiratory infection is an illness that affects part of the respiratory system, such as the lungs, nose, or throat. Antibiotic medicines are not prescribed for viral infections. This is because antibiotics are designed to kill bacteria. They do not kill viruses. Take medications for cough as prescribed. You may take tylenol or ibuprofen as needed for fevers/headache/body aches. Drink plenty of fluids. Your urine test did not show any signs of a urinary tract infection. Testing for bacteria, yeast, gonorrhea, chlamydia and trichomonas is pending. You should not have any sexual activity until you receive the results of the tests. You will only be notified for positive results. You may go online to [place] and review your results.

## 2023-02-09 ENCOUNTER — Emergency Department (HOSPITAL_COMMUNITY)
Admission: EM | Admit: 2023-02-09 | Discharge: 2023-02-10 | Disposition: A | Discharge status: Home / Self Care | Payer: Medicaid Other | Attending: Emergency Medicine | Admitting: Emergency Medicine

## 2023-02-09 DIAGNOSIS — R072 Precordial pain: Secondary | ICD-10-CM | POA: Insufficient documentation

## 2023-02-09 DIAGNOSIS — D72829 Elevated white blood cell count, unspecified: Secondary | ICD-10-CM | POA: Insufficient documentation

## 2023-02-09 DIAGNOSIS — Z1152 Encounter for screening for COVID-19: Secondary | ICD-10-CM | POA: Insufficient documentation

## 2023-02-09 DIAGNOSIS — R059 Cough, unspecified: Secondary | ICD-10-CM | POA: Diagnosis not present

## 2023-02-09 LAB — CERVICOVAGINAL ANCILLARY ONLY
Bacterial Vaginitis (gardnerella): NEGATIVE
Candida Glabrata: NEGATIVE
Candida Vaginitis: POSITIVE — AB
Chlamydia: NEGATIVE
Comment: NEGATIVE
Comment: NEGATIVE
Comment: NEGATIVE
Comment: NEGATIVE
Comment: NEGATIVE
Comment: NORMAL
Neisseria Gonorrhea: NEGATIVE
Trichomonas: NEGATIVE

## 2023-02-09 LAB — POCT URINALYSIS DIPSTICK, ED / UC
Bilirubin Urine: NEGATIVE
Glucose, UA: NEGATIVE mg/dL
Hgb urine dipstick: NEGATIVE
Ketones, ur: NEGATIVE mg/dL
Leukocytes,Ua: NEGATIVE
Nitrite: NEGATIVE
Protein, ur: NEGATIVE mg/dL
Specific Gravity, Urine: 1.02 (ref 1.005–1.030)
Urobilinogen, UA: 1 mg/dL (ref 0.0–1.0)
pH: 5.5 (ref 5.0–8.0)

## 2023-02-09 NOTE — ED Triage Notes (Addendum)
Pt states, "I have really bad chest pain." States that it started about an hour ago after she was coughing. Reports that coughing worsens the pain. States that she has been sick with a cough and runny nose for a little over a week. Denies fever. Denies sick contacts. Reports a hx of asthma and states that she has used her inhaler and nebulizer more this week. No resp distress noted in triage.

## 2023-02-10 ENCOUNTER — Emergency Department (HOSPITAL_COMMUNITY): Payer: Medicaid Other

## 2023-02-10 ENCOUNTER — Telehealth (HOSPITAL_COMMUNITY): Payer: Self-pay | Admitting: Emergency Medicine

## 2023-02-10 LAB — CBC WITH DIFFERENTIAL/PLATELET
Abs Immature Granulocytes: 0.06 10*3/uL (ref 0.00–0.07)
Basophils Absolute: 0 10*3/uL (ref 0.0–0.1)
Basophils Relative: 0 %
Eosinophils Absolute: 0 10*3/uL (ref 0.0–0.5)
Eosinophils Relative: 0 %
HCT: 38.8 % (ref 36.0–46.0)
Hemoglobin: 12.3 g/dL (ref 12.0–15.0)
Immature Granulocytes: 0 %
Lymphocytes Relative: 8 %
Lymphs Abs: 1.2 10*3/uL (ref 0.7–4.0)
MCH: 26.6 pg (ref 26.0–34.0)
MCHC: 31.7 g/dL (ref 30.0–36.0)
MCV: 84 fL (ref 80.0–100.0)
Monocytes Absolute: 0.8 10*3/uL (ref 0.1–1.0)
Monocytes Relative: 5 %
Neutro Abs: 13 10*3/uL — ABNORMAL HIGH (ref 1.7–7.7)
Neutrophils Relative %: 87 %
Platelets: 178 10*3/uL (ref 150–400)
RBC: 4.62 MIL/uL (ref 3.87–5.11)
RDW: 15.3 % (ref 11.5–15.5)
WBC: 15 10*3/uL — ABNORMAL HIGH (ref 4.0–10.5)
nRBC: 0 % (ref 0.0–0.2)

## 2023-02-10 LAB — BASIC METABOLIC PANEL
Anion gap: 12 (ref 5–15)
BUN: 6 mg/dL (ref 6–20)
CO2: 18 mmol/L — ABNORMAL LOW (ref 22–32)
Calcium: 8.3 mg/dL — ABNORMAL LOW (ref 8.9–10.3)
Chloride: 105 mmol/L (ref 98–111)
Creatinine, Ser: 0.64 mg/dL (ref 0.44–1.00)
GFR, Estimated: >60 mL/min (ref >60)
Glucose, Bld: 126 mg/dL — ABNORMAL HIGH (ref 70–99)
Potassium: 3.3 mmol/L — ABNORMAL LOW (ref 3.5–5.1)
Sodium: 135 mmol/L (ref 135–145)

## 2023-02-10 LAB — HEPATIC FUNCTION PANEL
ALT: 15 U/L (ref 0–44)
AST: 15 U/L (ref 15–41)
Albumin: 4.4 g/dL (ref 3.5–5.0)
Alkaline Phosphatase: 56 U/L (ref 38–126)
Bilirubin, Direct: <0.1 mg/dL (ref 0.0–0.2)
Total Bilirubin: 0.7 mg/dL (ref 0.3–1.2)
Total Protein: 7 g/dL (ref 6.5–8.1)

## 2023-02-10 LAB — TROPONIN I (HIGH SENSITIVITY)
Troponin I (High Sensitivity): 2 ng/L (ref <18)
Troponin I (High Sensitivity): 4 ng/L (ref <18)

## 2023-02-10 MED ORDER — CYCLOBENZAPRINE HCL 10 MG PO TABS: 10.0000 mg | ORAL_TABLET | Freq: Two times a day (BID) | ORAL | 0 refills | Status: AC | PRN

## 2023-02-10 MED ORDER — SODIUM CHLORIDE 0.9 % IV BOLUS
1000.0000 mL | Freq: Once | INTRAVENOUS | Status: AC
  Administered 2023-02-10: 1000 mL via INTRAVENOUS

## 2023-02-10 MED ORDER — FLUCONAZOLE 150 MG PO TABS: 150.0000 mg | ORAL_TABLET | Freq: Once | ORAL | 0 refills | Status: AC

## 2023-02-10 MED ORDER — METHOCARBAMOL 500 MG PO TABS
1000.0000 mg | ORAL_TABLET | Freq: Once | ORAL | Status: AC
  Administered 2023-02-10: 1000 mg via ORAL
  Filled 2023-02-10: qty 2

## 2023-02-10 MED ORDER — KETOROLAC TROMETHAMINE 15 MG/ML IJ SOLN
15.0000 mg | Freq: Once | INTRAMUSCULAR | Status: AC
  Administered 2023-02-10: 15 mg via INTRAVENOUS
  Filled 2023-02-10: qty 1

## 2023-02-10 NOTE — Discharge Instructions (Addendum)
Likely a viral infection, recommend over-the-counter pain medications like ibuprofen Tylenol for fever and pain control, nasal decongestions like Flonase and Zyrtec, Mucinex for cough.  If not eating recommend supplementing with Gatorade to help with electrolyte supplementation.  Given you a medication called Flexeril this will help with your chest pain, can make you drowsy do not consume alcohol or operate heavy machinery.  You may supplement with ibuprofen and Tylenol.  Follow-up PCP for further evaluation.  Come back to the emergency department if you develop chest pain, shortness of breath, severe abdominal pain, uncontrolled nausea, vomiting, diarrhea.

## 2023-02-10 NOTE — ED Provider Notes (Signed)
[facility] Provider Note   CSN: [id number] Arrival date & time: 02/09/23  2341     History  Chief Complaint  Patient presents with   Chest Pain    Cheyenne Cantu is a 27 y.o. female.  HPI   Patient without significant medical history presented with complaints of chest pain, chest pain started about 2 hours ago, came on suddenly, states it happened after she was coughing, pain remains mid upper chest, does not radiate, states pain is worse after coughs, states she has slightly productive cough, no associate fevers or chills, states that healing hurts more when she breathes, but denies actual pleuritic chest pain, no cardiac history, no history of PEs or DVTs currently not on oral birth control, no family history of heart abnormalities, not being treated for hypertension diabetes hyperlipidemia.  She has no other complaints.    Home Medications Prior to Admission medications   Medication Sig Start Date End Date Taking? Authorizing Provider  cyclobenzaprine (FLEXERIL) 10 MG tablet Take 1 tablet (10 mg total) by mouth 2 (two) times daily as needed for muscle spasms. 02/10/23  Yes Marcello Fennel, PA-C  albuterol (VENTOLIN HFA) 108 (90 Base) MCG/ACT inhaler Inhale 2 puffs into the lungs every 4 (four) hours as needed for wheezing or shortness of breath. 02/08/23   Enrique Sack, FNP  brompheniramine-pseudoephedrine-DM 30-2-10 MG/5ML syrup Take 5 mLs by mouth every 4 (four) hours as needed (cough). 02/08/23   Enrique Sack, FNP  predniSONE (DELTASONE) 20 MG tablet Take 2 tablets (40 mg total) by mouth daily for 5 days. 02/08/23 02/13/23  Enrique Sack, [place]      Allergies    Patient has no known allergies.    Review of Systems   Review of Systems  Constitutional:  Negative for chills and fever.  Respiratory:  Positive for cough. Negative for shortness of breath.   Cardiovascular:  Positive for chest pain.   Gastrointestinal:  Negative for abdominal pain.  Neurological:  Negative for headaches.    Physical Exam Updated Vital Signs BP 115/62   Pulse 68   Temp 98.4 F (36.9 C) (Oral)   Resp 20   Ht '5\' 2"'$  (1.575 m)   Wt 81.6 kg   LMP 01/10/2023 (Exact Date)   SpO2 100%   BMI 32.92 kg/m  Physical Exam Vitals and nursing note reviewed.  Constitutional:      General: She is not in acute distress.    Appearance: She is not ill-appearing.  HENT:     Head: Normocephalic and atraumatic.     Nose: No congestion.  Eyes:     Conjunctiva/sclera: Conjunctivae normal.  Cardiovascular:     Rate and Rhythm: Normal rate and regular rhythm.     Pulses: Normal pulses.     Heart sounds: No murmur heard.    No friction rub. No gallop.  Pulmonary:     Effort: No respiratory distress.     Breath sounds: No wheezing, rhonchi or rales.     Comments: Chest pain is focalized reproducible along the sternum no crepitus or deformities noted. Abdominal:     Palpations: Abdomen is soft.     Tenderness: There is no abdominal tenderness. There is no right CVA tenderness or left CVA tenderness.  Musculoskeletal:     Right lower leg: No edema.     Left lower leg: No edema.     Comments: No unilateral leg swelling no calf tenderness no palpable cords.  Skin:    General: Skin is warm and dry.  Neurological:     Mental Status: She is alert.  Psychiatric:        Mood and Affect: Mood normal.     ED Results / Procedures / Treatments   Labs (all labs ordered are listed, but only abnormal results are displayed) Labs Reviewed  BASIC METABOLIC PANEL - Abnormal; Notable for the following components:      Result Value   Potassium 3.3 (*)    CO2 18 (*)    Glucose, Bld 126 (*)    Calcium 8.3 (*)    All other components within normal limits  CBC WITH DIFFERENTIAL/PLATELET - Abnormal; Notable for the following components:   WBC 15.0 (*)    Neutro Abs 13.0 (*)    All other components within normal limits   HEPATIC FUNCTION PANEL  TROPONIN I (HIGH SENSITIVITY)  TROPONIN I (HIGH SENSITIVITY)    EKG EKG Interpretation  Date/Time:  Monday February 09 2023 23:49:56 EDT Ventricular Rate:  69 PR Interval:  158 QRS Duration: 89 QT Interval:  371 QTC Calculation: 398 R Axis:   62 Text Interpretation: Sinus rhythm RSR' in V1 or V2, right VCD or RVH Confirmed by Ripley Fraise [id number]) on 02/10/2023 12:04:01 AM  Radiology DG Chest 2 View  Result Date: 02/10/2023 CLINICAL DATA:  chest pain EXAM: CHEST - 2 VIEW COMPARISON:  None Available. FINDINGS: The heart and mediastinal contours are within normal limits. No focal consolidation. No pulmonary edema. No pleural effusion. No pneumothorax. No acute osseous abnormality. IMPRESSION: No active cardiopulmonary disease. Electronically Signed   By: Iven Finn M.D.   On: 02/10/2023 00:21    Procedures Procedures    Medications Ordered in ED Medications  methocarbamol (ROBAXIN) tablet 1,000 mg (1,000 mg Oral Given 02/10/23 0240)  sodium chloride 0.9 % bolus 1,000 mL (0 mLs Intravenous Stopped 02/10/23 0517)  ketorolac (TORADOL) 15 MG/ML injection 15 mg (15 mg Intravenous Given 02/10/23 [id number])    ED Course/ Medical Decision Making/ A&P                             Medical Decision Making Amount and/or Complexity of Data Reviewed Labs: ordered. Radiology: ordered.  Risk Prescription drug management.   This patient presents to the ED for concern of chest pain, this involves an extensive number of treatment options, and is a complaint that carries with it a high risk of complications and morbidity.  The differential diagnosis includes ACS, PE, dissection, pneumonia    Additional history obtained:  Additional history obtained from partner at bedside External records from outside source obtained and reviewed including recent ER notes   Co morbidities that complicate the patient evaluation  N/A  Social Determinants of Health:  No  primary care provider    Lab Tests:  I Ordered, and personally interpreted labs.  The pertinent results include: CBC shows leukocytosis, BMP shows potassium 3.3, CO2 of 18, glucose 128, calcium 8.3, negative delta troponin, hepatic panel negative   Imaging Studies ordered:  I ordered imaging studies including x-ray of the chest I independently visualized and interpreted imaging which showed negative for acute findings I agree with the radiologist interpretation   Cardiac Monitoring:  The patient was maintained on a cardiac monitor.  I personally viewed and interpreted the cardiac monitored which showed an underlying rhythm of: EKG without signs of ischemia   Medicines ordered and prescription drug management:  I ordered medication including Robaxin I have reviewed the patients home medicines and have made adjustments as needed  Critical Interventions:  N/A   Reevaluation: Presenting with chest pain, she had a benign physical exam, will obtain basic lab work imaging and reassess.  Patient is reassessed states she is feeling better, initial lab work is unremarkable we will continue to monitor  Updated the patient on lab work she has no complaints agreement discharge at this time.   Consultations Obtained:  N/A     Test Considered:  N/A    Rule out I have low suspicion for ACS as history is atypical, patient has no cardiac history, EKG was sinus rhythm without signs of ischemia, patient had negative delta troponin.  Low suspicion for PE as patient denies pleuritic chest pain, shortness of breath, patient denies leg pain, no pedal edema noted on exam, patient was PERC negative.  Low suspicion for AAA or aortic dissection as history is atypical, patient has low risk factors.  Low suspicion for systemic infection as patient is nontoxic-appearing, vital signs reassuring, no obvious source infection noted on exam.  There is no the patient does have an elevated white count  but she is currently on steroids I suspect this is likely the cause, I doubt infectious etiology as she is nontoxic-appearing vital signs reassuring afebrile nontachycardic.     Dispostion and problem list  After consideration of the diagnostic results and the patients response to treatment, I feel that the patent would benefit from discharge.  Precordial chest pain-likely muscular in nature, will discharge home with a muscle relaxer, have her continue with over-the-counter pain medications, will have her continue with the steroids and cough suppressants as given to her by her urgent care provider, will defer on antibiotics no evidence of pneumonia seen on chest x-ray             Final Clinical Impression(s) / ED Diagnoses Final diagnoses:  Precordial chest pain    Rx / DC Orders ED Discharge Orders          Ordered    cyclobenzaprine (FLEXERIL) 10 MG tablet  2 times daily PRN        02/10/23 0606              Marcello Fennel, PA-C 02/10/23 0606    Ripley Fraise, MD 02/10/23 [phone]
